# Patient Record
Sex: Male | Born: 1980 | Race: Black or African American | Hispanic: No | Marital: Single | Smoking: Current every day smoker
Health system: Southern US, Community
[De-identification: ages and names within clinical notes are randomized; demographics above are authoritative.]

## PROBLEM LIST (undated history)

## (undated) DIAGNOSIS — R0600 Dyspnea, unspecified: Secondary | ICD-10-CM

## (undated) DIAGNOSIS — R569 Unspecified convulsions: Secondary | ICD-10-CM

---

## 2012-07-22 HISTORY — PX: ORIF DISTAL RADIUS FRACTURE: SUR927

## 2014-07-28 ENCOUNTER — Encounter (HOSPITAL_COMMUNITY): Payer: Self-pay | Admitting: Cardiology

## 2014-07-28 ENCOUNTER — Emergency Department (HOSPITAL_COMMUNITY)
Admission: EM | Admit: 2014-07-28 | Discharge: 2014-07-28 | Disposition: A | Discharge status: Home / Self Care | Payer: Self-pay | Attending: Emergency Medicine | Admitting: Emergency Medicine

## 2014-07-28 DIAGNOSIS — Y9389 Activity, other specified: Secondary | ICD-10-CM | POA: Insufficient documentation

## 2014-07-28 DIAGNOSIS — Z720 Tobacco use: Secondary | ICD-10-CM | POA: Insufficient documentation

## 2014-07-28 DIAGNOSIS — S4991XA Unspecified injury of right shoulder and upper arm, initial encounter: Secondary | ICD-10-CM | POA: Insufficient documentation

## 2014-07-28 DIAGNOSIS — Y998 Other external cause status: Secondary | ICD-10-CM | POA: Insufficient documentation

## 2014-07-28 DIAGNOSIS — S4992XA Unspecified injury of left shoulder and upper arm, initial encounter: Secondary | ICD-10-CM | POA: Insufficient documentation

## 2014-07-28 DIAGNOSIS — S3992XA Unspecified injury of lower back, initial encounter: Secondary | ICD-10-CM | POA: Insufficient documentation

## 2014-07-28 DIAGNOSIS — Y92410 Unspecified street and highway as the place of occurrence of the external cause: Secondary | ICD-10-CM | POA: Insufficient documentation

## 2014-07-28 DIAGNOSIS — S161XXA Strain of muscle, fascia and tendon at neck level, initial encounter: Secondary | ICD-10-CM | POA: Insufficient documentation

## 2014-07-28 MED ORDER — TRAMADOL HCL 50 MG PO TABS: 50.0000 mg | ORAL_TABLET | Freq: Four times a day (QID) | ORAL | Status: DC | PRN

## 2014-07-28 MED ORDER — NAPROXEN 500 MG PO TABS: 500.0000 mg | ORAL_TABLET | Freq: Two times a day (BID) | ORAL | Status: DC
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
## 2015-07-05 ENCOUNTER — Encounter (HOSPITAL_COMMUNITY): Payer: Self-pay | Admitting: Emergency Medicine

## 2015-07-05 ENCOUNTER — Emergency Department (HOSPITAL_COMMUNITY)
Admission: EM | Admit: 2015-07-05 | Discharge: 2015-07-05 | Disposition: A | Discharge status: Home / Self Care | Payer: Self-pay | Attending: Emergency Medicine | Admitting: Emergency Medicine

## 2015-07-05 DIAGNOSIS — Y9289 Other specified places as the place of occurrence of the external cause: Secondary | ICD-10-CM | POA: Insufficient documentation

## 2015-07-05 DIAGNOSIS — F430 Acute stress reaction: Secondary | ICD-10-CM

## 2015-07-05 DIAGNOSIS — F410 Panic disorder [episodic paroxysmal anxiety] without agoraphobia: Secondary | ICD-10-CM | POA: Insufficient documentation

## 2015-07-05 DIAGNOSIS — G40909 Epilepsy, unspecified, not intractable, without status epilepticus: Secondary | ICD-10-CM | POA: Insufficient documentation

## 2015-07-05 DIAGNOSIS — S0993XA Unspecified injury of face, initial encounter: Secondary | ICD-10-CM | POA: Insufficient documentation

## 2015-07-05 DIAGNOSIS — Y9389 Activity, other specified: Secondary | ICD-10-CM | POA: Insufficient documentation

## 2015-07-05 DIAGNOSIS — F17200 Nicotine dependence, unspecified, uncomplicated: Secondary | ICD-10-CM | POA: Insufficient documentation

## 2015-07-05 DIAGNOSIS — W1839XA Other fall on same level, initial encounter: Secondary | ICD-10-CM | POA: Insufficient documentation

## 2015-07-05 DIAGNOSIS — Z79899 Other long term (current) drug therapy: Secondary | ICD-10-CM | POA: Insufficient documentation

## 2015-07-05 DIAGNOSIS — Y998 Other external cause status: Secondary | ICD-10-CM | POA: Insufficient documentation

## 2015-07-05 HISTORY — DX: Unspecified convulsions: R56.9

## 2015-07-05 MED ORDER — LEVETIRACETAM 500 MG PO TABS: 500.0000 mg | ORAL_TABLET | Freq: Two times a day (BID) | ORAL | Status: DC

## 2015-07-05 MED ORDER — LORAZEPAM 1 MG PO TABS
1.0000 mg | ORAL_TABLET | Freq: Once | ORAL | Status: AC
  Administered 2015-07-05: 1 mg via ORAL
  Filled 2015-07-05: qty 1

## 2015-07-05 MED ORDER — LEVETIRACETAM 500 MG PO TABS
1000.0000 mg | ORAL_TABLET | Freq: Once | ORAL | Status: AC
  Administered 2015-07-05: 1000 mg via ORAL
  Filled 2015-07-05: qty 2

## 2015-07-05 NOTE — ED Notes (Signed)
Bed: WA05 Expected date:  Expected time:  Means of arrival:  Comments: EMS 

## 2015-07-05 NOTE — ED Notes (Signed)
Bed: WA21 Expected date:  Expected time:  Means of arrival:  Comments: EMS- 34yo M, dizziness/weakness

## 2015-07-05 NOTE — ED Notes (Signed)
MD at bedside. 

## 2015-07-05 NOTE — ED Notes (Signed)
AVS explained in detail. Medication regimen explained. All paperwork given to [employer] to give to nurse at jail. No other c/c. Pt A&Ox4. Neurologically intact.

## 2015-07-05 NOTE — ED Notes (Addendum)
Per EMS- upon arrival to jail, pt reports hx seizures. Pt says he feels like he is going to have a seizure if he stays at the jail. Reports stress-induced seizures. No actual seizure witnessed. Melina Fiddlerakes Qurasol at 5 am and 9 pm-received from "some hospital in [place]." No other complaints. VSS: BP 148/74 HR 84 RR 26 CBG 94 mg/dl. Also says seizures usually "happen while I'm sleeping."  Addendum-pt denies taking Qurasol but missed Keppra dose today. Also donated blood and plasma today.

## 2015-07-05 NOTE — Discharge Instructions (Signed)
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
## 2017-12-21 ENCOUNTER — Emergency Department (HOSPITAL_COMMUNITY): Payer: Self-pay

## 2017-12-21 ENCOUNTER — Other Ambulatory Visit: Payer: Self-pay

## 2017-12-21 ENCOUNTER — Encounter (HOSPITAL_COMMUNITY): Payer: Self-pay | Admitting: Emergency Medicine

## 2017-12-21 ENCOUNTER — Emergency Department (HOSPITAL_COMMUNITY)
Admission: EM | Admit: 2017-12-21 | Discharge: 2017-12-22 | Disposition: A | Discharge status: Home / Self Care | Payer: Self-pay | Attending: Emergency Medicine | Admitting: Emergency Medicine

## 2017-12-21 DIAGNOSIS — F17210 Nicotine dependence, cigarettes, uncomplicated: Secondary | ICD-10-CM | POA: Insufficient documentation

## 2017-12-21 DIAGNOSIS — Y9301 Activity, walking, marching and hiking: Secondary | ICD-10-CM | POA: Insufficient documentation

## 2017-12-21 DIAGNOSIS — S62231A Other displaced fracture of base of first metacarpal bone, right hand, initial encounter for closed fracture: Secondary | ICD-10-CM

## 2017-12-21 DIAGNOSIS — W109XXA Fall (on) (from) unspecified stairs and steps, initial encounter: Secondary | ICD-10-CM | POA: Insufficient documentation

## 2017-12-21 DIAGNOSIS — Y929 Unspecified place or not applicable: Secondary | ICD-10-CM | POA: Insufficient documentation

## 2017-12-21 DIAGNOSIS — Z79899 Other long term (current) drug therapy: Secondary | ICD-10-CM | POA: Insufficient documentation

## 2017-12-21 DIAGNOSIS — Y999 Unspecified external cause status: Secondary | ICD-10-CM | POA: Insufficient documentation

## 2017-12-21 NOTE — ED Triage Notes (Signed)
Pt fell going up steps approx 1 hour ago.  C/o pain and swelling to R thumb.

## 2017-12-22 MED ORDER — HYDROCODONE-ACETAMINOPHEN 5-325 MG PO TABS: 1.0000 | ORAL_TABLET | Freq: Four times a day (QID) | ORAL | 0 refills | Status: DC | PRN

## 2017-12-22 MED ORDER — HYDROCODONE-ACETAMINOPHEN 5-325 MG PO TABS
2.0000 | ORAL_TABLET | Freq: Once | ORAL | Status: AC
  Administered 2017-12-22: 2 via ORAL
  Filled 2017-12-22: qty 2

## 2017-12-22 MED ORDER — IBUPROFEN 600 MG PO TABS: 600.0000 mg | ORAL_TABLET | Freq: Four times a day (QID) | ORAL | 0 refills | Status: DC | PRN

## 2017-12-22 NOTE — ED Provider Notes (Signed)
[facility] Provider Note   CSN: [id number] Arrival date & time: 12/21/17  2301     History   Chief Complaint Chief Complaint  Patient presents with  . Hand Pain  . Fall    HPI Allyne GeeJames Springston is a 37 y.o. male.  HPI  Patient is a 37 year old male with a history of seizure disorder presenting for right hand pain after a fall.  Patient reports that he was walking upstairs with his hand in his pocket trying to get his keys out of the pocket, when he tripped on the steps, and was unable to get his hand out of the pocket.  He reports that he went down with the right thumb in flexion.  Patient reports he is a difficulty with range of motion of the thumb since then, and has noted significant swelling to the thenar compartment.  Patient denies any numbness or discoloration of the thumb after this incident.  No wound occurred.  Past Medical History:  Diagnosis Date  . Seizures (HCC)     There are no active problems to display for this patient.   History reviewed. No pertinent surgical history.      Home Medications    Prior to Admission medications   Medication Sig Start Date End Date Taking? Authorizing Provider  levETIRAcetam (KEPPRA) 1000 MG tablet Take 1,000 mg by mouth 2 (two) times daily.    [provider]  levETIRAcetam (KEPPRA) 500 MG tablet Take 1 tablet (500 mg total) by mouth 2 (two) times daily. Administer while patient is incarcerated. First dose today given the morning of 12\15 Can resume his home medication once out of jail. 07/05/15   Arby BarrettePfeiffer, Marcy, MD  LORazepam (ATIVAN) 1 MG tablet Take 1 mg by mouth every 8 (eight) hours as needed for anxiety or sleep.    [provider]    Family History No family history on file.  Social History Social History   Tobacco Use  . Smoking status: Current Every Day Smoker  . Smokeless tobacco: Never Used  Substance Use Topics  . Alcohol use: Yes  . Drug use: No      Allergies   Patient has no known allergies.   Review of Systems Review of Systems  Musculoskeletal: Positive for arthralgias and joint swelling.  Skin: Negative for color change and wound.  Neurological: Negative for weakness and numbness.     Physical Exam Updated Vital Signs BP 119/82 (BP Location: Left Arm)   Pulse 93   Temp 98.9 F (37.2 C) (Oral)   Resp 16   Ht 5\' 10"  (1.778 m)   Wt 68 kg (150 lb)   SpO2 100%   BMI 21.52 kg/m   Physical Exam  Constitutional: He appears well-developed and well-nourished. No distress.  Sitting comfortably in bed.  HENT:  Head: Normocephalic and atraumatic.  Eyes: Conjunctivae are normal. Right eye exhibits no discharge. Left eye exhibits no discharge.  EOMs normal to gross examination.  Neck: Normal range of motion.  Cardiovascular: Normal rate and regular rhythm.  Intact, 2+ radial pulse of RUE.   Pulmonary/Chest:  Normal respiratory effort. Patient converses comfortably. No audible wheeze or stridor.  Abdominal: Soft. He exhibits no distension.  Musculoskeletal: Normal range of motion.  Right Hand Exam:  Inspection: Swelling of the thenar compartment. Palpation: Point tenderness of the carpometacarpal joint of the thumb.  No anatomic snuffbox tenderness.  No distal radius tenderness. ROM: Patient able to perform range of motion of  all fingers of the right hand. Ligamentous stability: Unable to assess laxity of the thumb with valgus stress due to acute injury.  Flexor/Extensor tendons: FDS/FDP tendons intact in digits 2-5 at PIP/DIP joints, respectively; extensor tendons intact in all digits Nerve testing:  -Radial: Thumb/wrist extension intact and 5/5 strength with resistance. Sensation to light touch intact at dorsal CMC joint. -Median: Thumb opposition intact and 5/5 strength with resistance. Sensation to light touch intact on palmar side of 1st and 2nd digits. -Ulnar: Thumb adduction intact and 5/5 strength with  resistance. Sensation to light touch intact on palmar side of 5th digit. Vascular: 2+ radial and ulnar pulses. Capillary refill <2 seconds b/l.   Neurological: He is alert.  Cranial nerves intact to gross observation. Patient moves extremities without difficulty.  Skin: Skin is warm and dry. He is not diaphoretic.  Psychiatric: He has a normal mood and affect. His behavior is normal. Judgment and thought content normal.  Nursing note and vitals reviewed.    ED Treatments / Results  Labs (all labs ordered are listed, but only abnormal results are displayed) Labs Reviewed - No data to display  EKG None  Radiology Dg Hand Complete Right  Result Date: 12/21/2017 CLINICAL DATA:  Fall tonight down steps with right thumb pain and swelling. EXAM: RIGHT HAND - COMPLETE 3+ VIEW COMPARISON:  None. FINDINGS: Comminuted fracture at the base of the thumb metacarpal extends into the carpal metacarpal joint. There is a 2 mm displacement of fracture fragments. Fracture line involving the articular surface is mildly displaced. No additional fracture of the hand. Alignment is otherwise maintained. IMPRESSION: Comminuted displaced fracture base of the thumb metacarpal, with intra-articular extension to the carpal metacarpal joint. Electronically Signed   By: Rubye Oaks M.D.   On: 12/21/2017 23:47    Procedures Procedures (including critical care time)  Medications Ordered in ED Medications  HYDROcodone-acetaminophen (NORCO/VICODIN) 5-325 MG per tablet 2 tablet (2 tablets Oral Given 12/22/17 0038)     Initial Impression / Assessment and Plan / ED Course  I have reviewed the triage vital signs and the nursing notes.  Pertinent labs & imaging results that were available during my care of the patient were reviewed by me and considered in my medical decision making (see chart for details).  Clinical Course as of Dec 22 128  Wynelle Link Dec 21, 2017  1245 Patient verbally verified a safe ride from the  ED. Proceeded with prescribing Norco for pain/relaxtion/muscle relaxation in the ED.   [AM]  Mon Dec 22, 2017  0111 I have reviewed the patient's information in the [place] Controlled Substance Database for the past 12 months and found them to have no Rx on record.  Opiates were prescribed for an acute, painful condition. The patient was given information on side effects and encouraged to use other, non-opiate pain medication primary, only using opiate medicine sparingly for severe pain.   [AM]    Clinical Course User Index [AM] Elisha Ponder, PA-C   Patient well-appearing neurovascular intact after injury to the right hand.  Patient has a comminuted and slightly displaced fracture at base of thumb metacarpal with extension into the carpometacarpal joint of the thumb of right hand.  Patient is not on spica splint.  Good 2+ capillary refill after splint placement.  Patient given follow-up with hand for further evaluation.  Patient instructed that he must keep it immobilized, as he has an intra-articular fracture.  Patient is understanding and agrees with plan of care.  This is a supervised visit with Dr. Drema Pry. Evaluation, management, and discharge planning discussed with this attending physician.  Final Clinical Impressions(s) / ED Diagnoses   Final diagnoses:  Closed displaced fracture of base of first metacarpal bone of right hand, unspecified fracture morphology, initial encounter    ED Discharge Orders    None       Delia Chimes 12/22/17 0133    Nira Conn, MD 12/22/17 [phone]

## 2017-12-22 NOTE — ED Notes (Signed)
Pt has ice on the wrist and thumb

## 2017-12-22 NOTE — ED Notes (Signed)
Ortho tech at bedside 

## 2017-12-22 NOTE — ED Notes (Signed)
Pt verbalized understanding of d/c instructions and follow up as well as splint care

## 2017-12-22 NOTE — Discharge Instructions (Addendum)
Please see the information and instructions below regarding your visit.  Your diagnoses today include:  1. Closed displaced fracture of base of first metacarpal bone of right hand, unspecified fracture morphology, initial encounter     Tests performed today include: See side panel of your discharge paperwork for testing performed today. Vital signs are listed at the bottom of these instructions.   You have a fracture at the base of your thumb.  Is very important to follow-up with orthopedics for this.  Medications prescribed:    Take any prescribed medications only as prescribed, and any over the counter medications only as directed on the packaging.  You have been prescribed Norco for pain. This is an opioid pain medication. You may take this medication every 4-6 hours as needed for pain. Only take this medication if you need it for breakthrough pain. You may combine this medicine with ibuoprofen, a non-steroidal anti-inflammatory drug (NSAID) every 6 hours, so you are getting something for pain relief every 3 hours.  Do not combine this medication with Tylenol, as it may increase the risk of liver problems.  Do not combine this medication with alcohol.  Please be advised to avoid driving or operating heavy machinery while taking this medication, as it may make you drowsy or impair judgment.   You are prescribed ibuprofen, a non-steroidal anti-inflammatory agent (NSAID) for pain. You may take 600mg  every 6 hours as needed for pain. If still requiring this medication around the clock for acute pain after 10 days, please see your primary healthcare provider.  Women who are pregnant, breastfeeding, or planning on becoming pregnant should not take non-steroidal anti-inflammatories such as Advil and Aleve. Tylenol is a safe over the counter pain reliever in pregnant women.  This is not a long-term medication unless under the care and direction of your primary provider. Taking this medication  long-term and not under the supervision of a healthcare provider could increase the risk of stomach ulcers, kidney problems, and cardiovascular problems such as high blood pressure.    Home care instructions:  Please follow any educational materials contained in this packet.   Follow-up instructions: Please follow-up with Dr. Dion SaucierLandau of orthopedics.   Return instructions:  Please return to the Emergency Department if you experience worsening symptoms.  Please return to the emergency department if you experience any worsening pain of the thumb, discoloration where it is white or blue, or loss of sensation or cold sensation to the thumb. Please return if you have any other emergent concerns.  Additional Information:   Your vital signs today were: BP 119/82 (BP Location: Left Arm)    Pulse 93    Temp 98.9 F (37.2 C) (Oral)    Resp 16    Ht 5\' 10"  (1.778 m)    Wt 68 kg (150 lb)    SpO2 100%    BMI 21.52 kg/m  If your blood pressure (BP) was elevated on multiple readings during this visit above 130 for the top number or above 80 for the bottom number, please have this repeated by your primary care provider within one month. --------------  Thank you for allowing us to participate in your care today.

## 2017-12-22 NOTE — Progress Notes (Signed)
Orthopedic Tech Progress Note Patient Details:  Allyne GeeJames Crumm 08/05/1980 [id number]  Ortho Devices Type of Ortho Device: Arm sling, Thumb spica splint Splint Material: Fiberglass Ortho Device/Splint Location: rue Ortho Device/Splint Interventions: Ordered, Application, Adjustment   Post Interventions Patient Tolerated: Well Instructions Provided: Care of device, Adjustment of device   Trinna PostMartinez, Ronon Ferger J 12/22/2017, 1:44 AM

## 2017-12-26 ENCOUNTER — Ambulatory Visit (HOSPITAL_BASED_OUTPATIENT_CLINIC_OR_DEPARTMENT_OTHER): Payer: Self-pay | Admitting: Anesthesiology

## 2017-12-26 ENCOUNTER — Ambulatory Visit (HOSPITAL_BASED_OUTPATIENT_CLINIC_OR_DEPARTMENT_OTHER)
Admission: RE | Admit: 2017-12-26 | Discharge: 2017-12-26 | Disposition: A | Discharge status: Home / Self Care | Payer: Self-pay | Source: Ambulatory Visit | Attending: Orthopedic Surgery | Admitting: Orthopedic Surgery

## 2017-12-26 ENCOUNTER — Encounter (HOSPITAL_BASED_OUTPATIENT_CLINIC_OR_DEPARTMENT_OTHER)
Admission: RE | Disposition: A | Discharge status: Home / Self Care | Payer: Self-pay | Source: Ambulatory Visit | Attending: Orthopedic Surgery

## 2017-12-26 ENCOUNTER — Encounter (HOSPITAL_BASED_OUTPATIENT_CLINIC_OR_DEPARTMENT_OTHER): Payer: Self-pay

## 2017-12-26 ENCOUNTER — Other Ambulatory Visit: Payer: Self-pay | Admitting: Orthopedic Surgery

## 2017-12-26 DIAGNOSIS — R569 Unspecified convulsions: Secondary | ICD-10-CM | POA: Insufficient documentation

## 2017-12-26 DIAGNOSIS — R0600 Dyspnea, unspecified: Secondary | ICD-10-CM | POA: Insufficient documentation

## 2017-12-26 DIAGNOSIS — F17200 Nicotine dependence, unspecified, uncomplicated: Secondary | ICD-10-CM | POA: Insufficient documentation

## 2017-12-26 DIAGNOSIS — Z79899 Other long term (current) drug therapy: Secondary | ICD-10-CM | POA: Insufficient documentation

## 2017-12-26 DIAGNOSIS — W109XXA Fall (on) (from) unspecified stairs and steps, initial encounter: Secondary | ICD-10-CM | POA: Insufficient documentation

## 2017-12-26 DIAGNOSIS — S62231A Other displaced fracture of base of first metacarpal bone, right hand, initial encounter for closed fracture: Secondary | ICD-10-CM | POA: Insufficient documentation

## 2017-12-26 HISTORY — PX: CLOSED REDUCTION METACARPAL WITH PERCUTANEOUS PINNING: SHX5613

## 2017-12-26 HISTORY — DX: Dyspnea, unspecified: R06.00

## 2017-12-26 SURGERY — CLOSED REDUCTION, FRACTURE, METACARPAL BONE, WITH PERCUTANEOUS PINNING
Anesthesia: General | Laterality: Right

## 2017-12-26 MED ORDER — FENTANYL CITRATE (PF) 100 MCG/2ML IJ SOLN
INTRAMUSCULAR | Status: AC
  Filled 2017-12-26: qty 2

## 2017-12-26 MED ORDER — PROPOFOL 10 MG/ML IV BOLUS
INTRAVENOUS | Status: DC | PRN
  Administered 2017-12-26: 200 mg via INTRAVENOUS

## 2017-12-26 MED ORDER — ONDANSETRON HCL 4 MG/2ML IJ SOLN
INTRAMUSCULAR | Status: DC | PRN
  Administered 2017-12-26: 4 mg via INTRAVENOUS

## 2017-12-26 MED ORDER — CHLORHEXIDINE GLUCONATE 4 % EX LIQD
60.0000 mL | Freq: Once | CUTANEOUS | Status: DC
  Filled 2017-12-26: qty 118

## 2017-12-26 MED ORDER — CEFAZOLIN SODIUM-DEXTROSE 2-4 GM/100ML-% IV SOLN
INTRAVENOUS | Status: AC
  Filled 2017-12-26: qty 100

## 2017-12-26 MED ORDER — HYDROMORPHONE HCL 2 MG/ML IJ SOLN
INTRAMUSCULAR | Status: AC
  Filled 2017-12-26: qty 1

## 2017-12-26 MED ORDER — LIDOCAINE 2% (20 MG/ML) 5 ML SYRINGE
INTRAMUSCULAR | Status: AC
  Filled 2017-12-26: qty 5

## 2017-12-26 MED ORDER — DEXAMETHASONE SODIUM PHOSPHATE 10 MG/ML IJ SOLN
INTRAMUSCULAR | Status: AC
  Filled 2017-12-26: qty 1

## 2017-12-26 MED ORDER — ONDANSETRON HCL 4 MG/2ML IJ SOLN
INTRAMUSCULAR | Status: AC
  Filled 2017-12-26: qty 2

## 2017-12-26 MED ORDER — PROPOFOL 10 MG/ML IV BOLUS
INTRAVENOUS | Status: AC
  Filled 2017-12-26: qty 40

## 2017-12-26 MED ORDER — LIDOCAINE 2% (20 MG/ML) 5 ML SYRINGE
INTRAMUSCULAR | Status: DC | PRN
  Administered 2017-12-26: 60 mg via INTRAVENOUS

## 2017-12-26 MED ORDER — BUPIVACAINE HCL (PF) 0.25 % IJ SOLN
INTRAMUSCULAR | Status: DC | PRN
  Administered 2017-12-26: 9 mL

## 2017-12-26 MED ORDER — DEXAMETHASONE SODIUM PHOSPHATE 10 MG/ML IJ SOLN
INTRAMUSCULAR | Status: DC | PRN
  Administered 2017-12-26: 10 mg via INTRAVENOUS

## 2017-12-26 MED ORDER — FENTANYL CITRATE (PF) 100 MCG/2ML IJ SOLN
INTRAMUSCULAR | Status: DC | PRN
  Administered 2017-12-26 (×2): 50 ug via INTRAVENOUS

## 2017-12-26 MED ORDER — CEFAZOLIN SODIUM-DEXTROSE 2-4 GM/100ML-% IV SOLN
2.0000 g | INTRAVENOUS | Status: AC
  Administered 2017-12-26: 2 g via INTRAVENOUS
  Filled 2017-12-26: qty 100

## 2017-12-26 MED ORDER — LACTATED RINGERS IV SOLN
INTRAVENOUS | Status: DC
  Administered 2017-12-26: 10:00:00 via INTRAVENOUS
  Filled 2017-12-26: qty 1000

## 2017-12-26 MED ORDER — MIDAZOLAM HCL 5 MG/5ML IJ SOLN
INTRAMUSCULAR | Status: DC | PRN
  Administered 2017-12-26: 2 mg via INTRAVENOUS

## 2017-12-26 MED ORDER — HYDROMORPHONE HCL 1 MG/ML IJ SOLN
INTRAMUSCULAR | Status: DC | PRN
  Administered 2017-12-26: 1 mg via INTRAVENOUS

## 2017-12-26 MED ORDER — MIDAZOLAM HCL 2 MG/2ML IJ SOLN
INTRAMUSCULAR | Status: AC
  Filled 2017-12-26: qty 2

## 2017-12-26 SURGICAL SUPPLY — 64 items
APL SKNCLS STERI-STRIP NONHPOA (GAUZE/BANDAGES/DRESSINGS)
BANDAGE ACE 3X5.8 VEL STRL LF (GAUZE/BANDAGES/DRESSINGS) ×6 IMPLANT
BANDAGE ACE 4X5 VEL STRL LF (GAUZE/BANDAGES/DRESSINGS) IMPLANT
BENZOIN TINCTURE PRP APPL 2/3 (GAUZE/BANDAGES/DRESSINGS) IMPLANT
BLADE SURG 15 STRL LF DISP TIS (BLADE) ×1 IMPLANT
BLADE SURG 15 STRL SS (BLADE) ×3
BNDG CMPR 9X4 STRL LF SNTH (GAUZE/BANDAGES/DRESSINGS) ×1
BNDG ELASTIC 2X5.8 VLCR STR LF (GAUZE/BANDAGES/DRESSINGS) IMPLANT
BNDG ESMARK 4X9 LF (GAUZE/BANDAGES/DRESSINGS) ×3 IMPLANT
BNDG GAUZE ELAST 4 BULKY (GAUZE/BANDAGES/DRESSINGS) ×3 IMPLANT
CANISTER SUCT 1200ML W/VALVE (MISCELLANEOUS) IMPLANT
CLOSURE WOUND 1/2 X4 (GAUZE/BANDAGES/DRESSINGS)
CORD BIPOLAR FORCEPS 12FT (ELECTRODE) IMPLANT
COVER BACK TABLE 60X90IN (DRAPES) ×3 IMPLANT
CUFF TOURNIQUET SINGLE 18IN (TOURNIQUET CUFF) ×3 IMPLANT
DECANTER SPIKE VIAL GLASS SM (MISCELLANEOUS) IMPLANT
DRAPE EXTREMITY T 121X128X90 (DRAPE) ×3 IMPLANT
DRAPE LG THREE QUARTER DISP (DRAPES) ×6 IMPLANT
DRAPE OEC MINIVIEW 54X84 (DRAPES) ×3 IMPLANT
DRAPE SURG 17X23 STRL (DRAPES) ×3 IMPLANT
DURAPREP 26ML APPLICATOR (WOUND CARE) ×3 IMPLANT
GAUZE SPONGE 4X4 12PLY STRL (GAUZE/BANDAGES/DRESSINGS) ×3 IMPLANT
GAUZE SPONGE 4X4 16PLY XRAY LF (GAUZE/BANDAGES/DRESSINGS) IMPLANT
GAUZE XEROFORM 1X8 LF (GAUZE/BANDAGES/DRESSINGS) ×3 IMPLANT
GLOVE BIOGEL M STRL SZ7.5 (GLOVE) ×3 IMPLANT
GLOVE BIOGEL PI IND STRL 6.5 (GLOVE) ×1 IMPLANT
GLOVE BIOGEL PI INDICATOR 6.5 (GLOVE) ×2
GLOVE SURG SS PI 6.5 STRL IVOR (GLOVE) ×3 IMPLANT
GLOVE SURG SYN 8.0 (GLOVE) ×6 IMPLANT
GOWN STRL REUS W/TWL LRG LVL3 (GOWN DISPOSABLE) ×3 IMPLANT
GOWN STRL REUS W/TWL XL LVL3 (GOWN DISPOSABLE) ×6 IMPLANT
NEEDLE HYPO 25X1 1.5 SAFETY (NEEDLE) IMPLANT
NS IRRIG 1000ML POUR BTL (IV SOLUTION) IMPLANT
PACK BASIN DAY SURGERY FS (CUSTOM PROCEDURE TRAY) ×3 IMPLANT
PAD CAST 3X4 CTTN HI CHSV (CAST SUPPLIES) ×2 IMPLANT
PAD CAST 4YDX4 CTTN HI CHSV (CAST SUPPLIES) IMPLANT
PADDING CAST ABS 4INX4YD NS (CAST SUPPLIES) ×2
PADDING CAST ABS COTTON 4X4 ST (CAST SUPPLIES) ×1 IMPLANT
PADDING CAST COTTON 3X4 STRL (CAST SUPPLIES) ×6
PADDING CAST COTTON 4X4 STRL (CAST SUPPLIES)
PADDING UNDERCAST 2 STRL (CAST SUPPLIES) ×2
PADDING UNDERCAST 2X4 STRL (CAST SUPPLIES) ×1 IMPLANT
SPLINT PLASTER CAST XFAST 4X15 (CAST SUPPLIES) IMPLANT
SPLINT PLASTER XTRA FAST SET 4 (CAST SUPPLIES)
STOCKINETTE 4X48 STRL (DRAPES) ×3 IMPLANT
STRIP CLOSURE SKIN 1/2X4 (GAUZE/BANDAGES/DRESSINGS) IMPLANT
SUCTION FRAZIER HANDLE 10FR (MISCELLANEOUS)
SUCTION TUBE FRAZIER 10FR DISP (MISCELLANEOUS) IMPLANT
SUT ETHILON 4 0 PS 2 18 (SUTURE) IMPLANT
SUT ETHILON 5 0 PS 2 18 (SUTURE) IMPLANT
SUT MERSILENE 4 0 P 3 (SUTURE) IMPLANT
SUT VIC AB 4-0 P-3 18XBRD (SUTURE) IMPLANT
SUT VIC AB 4-0 P3 18 (SUTURE)
SUT VICRYL RAPIDE 4-0 (SUTURE) IMPLANT
SUT VICRYL RAPIDE 4/0 PS 2 (SUTURE) IMPLANT
SYR 10ML LL (SYRINGE) IMPLANT
SYR BULB 3OZ (MISCELLANEOUS) IMPLANT
TOWEL OR 17X24 6PK STRL BLUE (TOWEL DISPOSABLE) ×3 IMPLANT
TUBE CONNECTING 20'X1/4 (TUBING)
TUBE CONNECTING 20X1/4 (TUBING) IMPLANT
UNDERPAD 30X30 (UNDERPADS AND DIAPERS) ×3 IMPLANT
k wire double ended ×3 IMPLANT
microaire k - wire ×3 IMPLANT
splint plaster ×3 IMPLANT

## 2017-12-26 NOTE — Anesthesia Preprocedure Evaluation (Signed)
Anesthesia Evaluation  Patient identified by MRN, date of birth, ID band Patient awake    Reviewed: Allergy & Precautions, NPO status , Patient's Chart, lab work & pertinent test results  History of Anesthesia Complications Negative for: history of anesthetic complications  Airway Mallampati: II  TM Distance: >3 FB Neck ROM: Full    Dental no notable dental hx. (+) Dental Advisory Given   Pulmonary Current Smoker,    Pulmonary exam normal        Cardiovascular negative cardio ROS Normal cardiovascular exam     Neuro/Psych negative neurological ROS  negative psych ROS   GI/Hepatic negative GI ROS, Neg liver ROS,   Endo/Other  negative endocrine ROS  Renal/GU negative Renal ROS  negative genitourinary   Musculoskeletal   Abdominal   Peds negative pediatric ROS (+)  Hematology negative hematology ROS (+)   Anesthesia Other Findings   Reproductive/Obstetrics negative OB ROS                            Anesthesia Physical Anesthesia Plan  ASA: II  Anesthesia Plan: General   Post-op Pain Management:    Induction: Intravenous  PONV Risk Score and Plan: 2 and Ondansetron and Dexamethasone  Airway Management Planned: LMA  Additional Equipment:   Intra-op Plan:   Post-operative Plan: Extubation in OR  Informed Consent: I have reviewed the patients History and Physical, chart, labs and discussed the procedure including the risks, benefits and alternatives for the proposed anesthesia with the patient or authorized representative who has indicated his/her understanding and acceptance.   Dental advisory given  Plan Discussed with: CRNA and Anesthesiologist  Anesthesia Plan Comments:        Anesthesia Quick Evaluation  

## 2017-12-26 NOTE — Discharge Instructions (Signed)

## 2017-12-26 NOTE — Op Note (Signed)
Mark Shannon, Mark MEDICAL RECORD [id number] ACCOUNT [id number] DATE OF BIRTH:January 13, 1981 FACILITY: [facility] LOCATION: [facility] PHYSICIAN:Calene Paradiso A. Mina MarbleWEINGOLD, MD  OPERATIVE REPORT  DATE OF PROCEDURE:  12/26/2017  PREOPERATIVE DIAGNOSIS:  Right thumb intra-articular thumb metacarpal base fracture.  POSTOPERATIVE DIAGNOSIS:  Right thumb intraarticular thumb metacarpal base fracture.  PROCEDURE:  Closed reduction and percutaneous pinning of above.  SURGEON:  Dairl PonderMatthew Urian Martenson, MD  ASSISTANT:  None.  ANESTHESIA:  General.  COMPLICATIONS:  None.  DRAINS:  None.   DESCRIPTION OF PROCEDURE:  The patient was taken to the operating suite after induction of adequate general anesthetic.  Right upper extremity was prepped and draped in the usual sterile fashion.  An Esmarch was used to exsanguinate the limb.  Tourniquet  was then inflated to 250 mmHg.  At this point in time, longitudinal traction, pronation and downward pressure on the thumb metacarpal base was used to reduce the Bennett's fracture of the right thumb.  Under direct and fluoroscopic guidance, an 0.062  K-wire and 0.045 K-wire was driven in crossed fashion across the fracture site into the trapezium to stabilize the [facility] and reduced the fracture.  Fluoroscopic imaging in multiple views showed reduction of the fracture.  The K-wires were cut outside  the skin and bent upon themselves.  They were then dressed with Xeroform, 4 x 4's, fluffs and a radial gutter splint.  The patient tolerated this procedure well and went to recovery room in stable fashion.  TN/NUANCE  D:12/26/2017 T:12/26/2017 JOB:[id number]

## 2017-12-26 NOTE — Transfer of Care (Signed)
Immediate Anesthesia Transfer of Care Note  Patient: Mark Shannon  Procedure(s) Performed: CLOSED REDUCTION METACARPAL WITH PERCUTANEOUS PINNING RIGHT THUMB (Right )  Patient Location: PACU  Anesthesia Type:General  Level of Consciousness: drowsy  Airway & Oxygen Therapy: Patient Spontanous Breathing and Patient connected to face mask oxygen  Post-op Assessment: Report given to RN and Post -op Vital signs reviewed and stable  Post vital signs: Reviewed and stable  Last Vitals:  Vitals Value Taken Time  BP 108/58 12/26/2017 12:17 PM  Temp 36.4 C 12/26/2017 12:17 PM  Pulse 57 12/26/2017 12:22 PM  Resp 16 12/26/2017 12:22 PM  SpO2 98 % 12/26/2017 12:22 PM  Vitals shown include unvalidated device data.  Last Pain:  Vitals:   12/26/17 0934  TempSrc:   PainSc: 4       Patients Stated Pain Goal: 4 (12/26/17 0934)  Complications: No apparent anesthesia complications

## 2017-12-26 NOTE — H&P (Signed)
Mark Shannon is an 37 y.o. male.   Chief Complaint: right thumb pain HPI: patient's a very pleasant 37 year old left-hand-dominant male status post right hand trauma with radiographs that show displaced fracture of the base of the thumb metacarpal intra-articular  Past Medical History:  Diagnosis Date  . Dyspnea    on occiasona  . Seizures (HCC)     Past Surgical History:  Procedure Laterality Date  . ORIF DISTAL RADIUS FRACTURE Left 2014    History reviewed. No pertinent family history. Social History:  reports that he has been smoking.  He has a 10.00 pack-year smoking history. He has never used smokeless tobacco. He reports that he drinks about 0.6 oz of alcohol per week. He reports that he does not use drugs.  Allergies: No Known Allergies  Medications Prior to Admission  Medication Sig Dispense Refill  . HYDROcodone-acetaminophen (NORCO/VICODIN) 5-325 MG tablet Take 1-2 tablets by mouth every 6 (six) hours as needed. 8 tablet 0  . ibuprofen (ADVIL,MOTRIN) 600 MG tablet Take 1 tablet (600 mg total) by mouth every 6 (six) hours as needed. 30 tablet 0  . levETIRAcetam (KEPPRA) 500 MG tablet Take 1 tablet (500 mg total) by mouth 2 (two) times daily. Administer while patient is incarcerated. First dose today given the morning of 12\15 Can resume his home medication once out of jail. 60 tablet 0  . LORazepam (ATIVAN) 1 MG tablet Take 1 mg by mouth every 8 (eight) hours as needed for anxiety or sleep.      No results found for this or any previous visit (from the past 48 hour(s)). No results found.  Review of Systems  All other systems reviewed and are negative.   Blood pressure (!) 133/92, pulse (!) 57, temperature 99.4 F (37.4 C), temperature source Oral, resp. rate 16, height 5\' 11"  (1.803 m), weight 66.9 kg (147 lb 8 oz), SpO2 100 %. Physical Exam  Constitutional: He appears well-developed and well-nourished.  HENT:  Head: Normocephalic and atraumatic.  Neck: Normal range  of motion.  Cardiovascular: Normal rate.  Respiratory: Effort normal.  Musculoskeletal:       Right hand: He exhibits tenderness, bony tenderness, deformity and swelling.  Right thumb pain, swelling, and deformity status post trauma  Skin: Skin is warm.  Psychiatric: He has a normal mood and affect. His behavior is normal. Judgment and thought content normal.     Assessment/Plan 37 year old male with intra-articular thumb fracture at the metacarpal base. I discussed with the patient great detail the nature this injury and the need for restoration of joint congruity. Patient presents wrist and benefits and wished to proceed with closed reduction and percutaneous pinning of this fracture.  Mark ShoresMatthew A Oluwadamilola Rosamond, MD 12/26/2017, 11:37 AM

## 2017-12-26 NOTE — Anesthesia Procedure Notes (Signed)
Procedure Name: LMA Insertion Date/Time: 12/26/2017 11:46 AM Performed by: Yolonda Kidaarver, Lydiana Milley L, CRNA Pre-anesthesia Checklist: Patient identified, Emergency Drugs available, Suction available and Patient being monitored Patient Re-evaluated:Patient Re-evaluated prior to induction Oxygen Delivery Method: Circle system utilized Preoxygenation: Pre-oxygenation with 100% oxygen Induction Type: IV induction Ventilation: Mask ventilation without difficulty LMA: LMA inserted LMA Size: 4.0 Number of attempts: 1 Placement Confirmation: positive ETCO2,  CO2 detector and breath sounds checked- equal and bilateral Tube secured with: Tape Dental Injury: Teeth and Oropharynx as per pre-operative assessment

## 2017-12-26 NOTE — Op Note (Signed)
Please see dictated report [phone]

## 2017-12-29 ENCOUNTER — Encounter (HOSPITAL_BASED_OUTPATIENT_CLINIC_OR_DEPARTMENT_OTHER): Payer: Self-pay | Admitting: Orthopedic Surgery

## 2017-12-29 NOTE — Anesthesia Postprocedure Evaluation (Signed)
Anesthesia Post Note  Patient: Mark Shannon  Procedure(s) Performed: CLOSED REDUCTION METACARPAL WITH PERCUTANEOUS PINNING RIGHT THUMB (Right )     Anesthesia Post Evaluation  Last Vitals:  Vitals:   12/26/17 1315 12/26/17 1405  BP: 128/86 130/86  Pulse: (!) 57 (!) 57  Resp: 17 16  Temp:  36.7 C  SpO2: 99% 100%    Last Pain:  Vitals:   12/29/17 1327  TempSrc:   PainSc: 5                  Jaqlyn Gruenhagen,Atharva DANIEL

## 2021-10-28 ENCOUNTER — Other Ambulatory Visit: Payer: Self-pay

## 2021-10-28 ENCOUNTER — Emergency Department (HOSPITAL_COMMUNITY): Payer: Self-pay

## 2021-10-28 ENCOUNTER — Emergency Department (HOSPITAL_COMMUNITY)
Admission: EM | Admit: 2021-10-28 | Discharge: 2021-10-28 | Disposition: A | Discharge status: Home / Self Care | Payer: Self-pay | Attending: Emergency Medicine | Admitting: Emergency Medicine

## 2021-10-28 ENCOUNTER — Encounter (HOSPITAL_COMMUNITY): Payer: Self-pay

## 2021-10-28 DIAGNOSIS — H66001 Acute suppurative otitis media without spontaneous rupture of ear drum, right ear: Secondary | ICD-10-CM | POA: Insufficient documentation

## 2021-10-28 DIAGNOSIS — I10 Essential (primary) hypertension: Secondary | ICD-10-CM | POA: Insufficient documentation

## 2021-10-28 DIAGNOSIS — F17200 Nicotine dependence, unspecified, uncomplicated: Secondary | ICD-10-CM | POA: Insufficient documentation

## 2021-10-28 DIAGNOSIS — Z79899 Other long term (current) drug therapy: Secondary | ICD-10-CM | POA: Insufficient documentation

## 2021-10-28 DIAGNOSIS — R110 Nausea: Secondary | ICD-10-CM | POA: Insufficient documentation

## 2021-10-28 DIAGNOSIS — R0789 Other chest pain: Secondary | ICD-10-CM | POA: Insufficient documentation

## 2021-10-28 LAB — BASIC METABOLIC PANEL
Anion gap: 6 (ref 5–15)
BUN: 11 mg/dL (ref 6–20)
CO2: 27 mmol/L (ref 22–32)
Calcium: 8.9 mg/dL (ref 8.9–10.3)
Chloride: 107 mmol/L (ref 98–111)
Creatinine, Ser: 1.33 mg/dL — ABNORMAL HIGH (ref 0.61–1.24)
GFR, Estimated: >60 mL/min (ref >60)
Glucose, Bld: 92 mg/dL (ref 70–99)
Potassium: 3.8 mmol/L (ref 3.5–5.1)
Sodium: 140 mmol/L (ref 135–145)

## 2021-10-28 LAB — HEPATIC FUNCTION PANEL
ALT: 10 U/L (ref 0–44)
AST: 13 U/L — ABNORMAL LOW (ref 15–41)
Albumin: 3.7 g/dL (ref 3.5–5.0)
Alkaline Phosphatase: 50 U/L (ref 38–126)
Bilirubin, Direct: 0.1 mg/dL (ref 0.0–0.2)
Indirect Bilirubin: 0.3 mg/dL (ref 0.3–0.9)
Total Bilirubin: 0.4 mg/dL (ref 0.3–1.2)
Total Protein: 6.9 g/dL (ref 6.5–8.1)

## 2021-10-28 LAB — CBC
HCT: 41.1 % (ref 39.0–52.0)
Hemoglobin: 13.6 g/dL (ref 13.0–17.0)
MCH: 29.4 pg (ref 26.0–34.0)
MCHC: 33.1 g/dL (ref 30.0–36.0)
MCV: 88.8 fL (ref 80.0–100.0)
Platelets: 367 10*3/uL (ref 150–400)
RBC: 4.63 MIL/uL (ref 4.22–5.81)
RDW: 14.6 % (ref 11.5–15.5)
WBC: 8.8 10*3/uL (ref 4.0–10.5)
nRBC: 0 % (ref 0.0–0.2)

## 2021-10-28 LAB — LIPASE, BLOOD: Lipase: 33 U/L (ref 11–51)

## 2021-10-28 LAB — TROPONIN I (HIGH SENSITIVITY): Troponin I (High Sensitivity): 5 ng/L (ref <18)

## 2021-10-28 MED ORDER — LIDOCAINE VISCOUS HCL 2 % MT SOLN
15.0000 mL | Freq: Once | OROMUCOSAL | Status: AC
  Administered 2021-10-28: 15 mL via ORAL
  Filled 2021-10-28: qty 15

## 2021-10-28 MED ORDER — SUCRALFATE 1 GM/10ML PO SUSP: 1.0000 g | Freq: Three times a day (TID) | ORAL | 0 refills | Status: AC

## 2021-10-28 MED ORDER — PANTOPRAZOLE SODIUM 20 MG PO TBEC: 20.0000 mg | DELAYED_RELEASE_TABLET | Freq: Every day | ORAL | 0 refills | Status: AC

## 2021-10-28 MED ORDER — ALUM & MAG HYDROXIDE-SIMETH 200-200-20 MG/5ML PO SUSP
30.0000 mL | Freq: Once | ORAL | Status: AC
  Administered 2021-10-28: 30 mL via ORAL
  Filled 2021-10-28: qty 30

## 2021-10-28 MED ORDER — AMOXICILLIN-POT CLAVULANATE 875-125 MG PO TABS: 1.0000 | ORAL_TABLET | Freq: Two times a day (BID) | ORAL | 0 refills | Status: AC

## 2021-10-28 NOTE — ED Provider Notes (Addendum)
?Mark Shannon ?Provider Note ? ? ?CSN: [id number] ?Arrival date & time: 10/28/21  0805 ? ?  ? ?History ? ?Chief Complaint  ?Patient presents with  ? Otalgia  ? Allergic Reaction  ? ? ?Mark Shannon is a 41 y.o. male who reports a history of hypertension and smoking, otherwise healthy no daily medication use. ? ?Patient presented to the ER today for 2 separate problems. ? ?Patient's primary concern is for a possible allergic reaction.  Patient reports that yesterday he bought some pomegranate/grape water, he reports he had never tried this before.  Patient reports that he developed chest pain after drinking this water, he reports it is a burning sensation in the epigastrium radiating up to the center part of his chest, he reports pain was mild constant and without alleviating factors, has not attempted any medication prior to arrival for this.  He denies similar symptoms in the past.  Patient thought this may be an allergic reaction so he attempted to vomit without improvement.  Symptoms are associated with nausea. ? ?Patient's second concern is for right-sided otalgia.  Patient reports that he has noticed right ear pain for the past 7 days, he reports he is experiencing a "head cold" the week before which has mostly improved, right ear pain has continued, a constant pressure-like pain which is moderate in intensity no clear aggravating or alleviating factors and associated with some decreased hearing on that side. ? ? ?Patient has fever, chills, fall, injury, headache/vision changes, neck pain, cough/hemoptysis, abdominal pain, diarrhea, hematemesis, extremity swelling/color change or any additional concerns ? ? ?HPI ? ?  ? ?Home Medications ?Prior to Admission medications   ?Medication Sig Start Date End Date Taking? Authorizing Provider  ?amoxicillin-clavulanate (AUGMENTIN) 875-125 MG tablet Take 1 tablet by mouth every 12 (twelve) hours for 7 days. 10/28/21 11/04/21 Yes Nuala Alpha A, PA-C  ?pantoprazole (PROTONIX) 20 MG tablet Take 1 tablet (20 mg total) by mouth daily. 10/28/21 11/27/21 Yes Nuala Alpha A, PA-C  ?sucralfate (CARAFATE) 1 GM/10ML suspension Take 10 mLs (1 g total) by mouth 4 (four) times daily -  with meals and at bedtime. 10/28/21  Yes Deliah Boston, PA-C  ?   ? ?Allergies    ?Patient has no known allergies.   ? ?Review of Systems   ?Review of Systems Ten systems are reviewed and are negative for acute change except as noted in the HPI ? ?Physical Exam ?Updated Vital Signs ?BP (!) 129/98 (BP Location: Right Arm)   Pulse 77   Temp 98.4 ?F (36.9 ?C) (Oral)   Resp 14   Ht 6' (1.829 m)   Wt 79.4 kg   SpO2 99%   BMI 23.73 kg/m?  ?Physical Exam ?Constitutional:   ?   General: He is not in acute distress. ?   Appearance: Normal appearance. He is well-developed. He is not ill-appearing or diaphoretic.  ?HENT:  ?   Head: Normocephalic and atraumatic.  ?   Jaw: There is normal jaw occlusion.  ?   Right Ear: External ear normal. A middle ear effusion is present. No mastoid tenderness. Tympanic membrane is erythematous and bulging.  ?   Left Ear: Tympanic membrane and external ear normal.  ?   Mouth/Throat:  ?   Mouth: Mucous membranes are moist.  ?   Pharynx: Oropharynx is clear. Uvula midline.  ?Eyes:  ?   General: Vision grossly intact. Gaze aligned appropriately.  ?   Extraocular Movements: Extraocular movements intact.  ?  Conjunctiva/sclera: Conjunctivae normal.  ?   Pupils: Pupils are equal, round, and reactive to light.  ?Neck:  ?   Trachea: Trachea and phonation normal.  ?Cardiovascular:  ?   Rate and Rhythm: Normal rate and regular rhythm.  ?   Pulses: Normal pulses.  ?   Heart sounds: Normal heart sounds.  ?Pulmonary:  ?   Effort: Pulmonary effort is normal. No respiratory distress.  ?   Breath sounds: Normal breath sounds.  ?Abdominal:  ?   General: There is no distension.  ?   Palpations: Abdomen is soft.  ?   Tenderness: There is no abdominal tenderness.  There is no guarding or rebound. Negative signs include Murphy's sign.  ?Musculoskeletal:     ?   General: Normal range of motion.  ?   Cervical back: Normal range of motion and neck supple.  ?Skin: ?   General: Skin is warm and dry.  ?   Comments: No rashes or urticaria  ?Neurological:  ?   Mental Status: He is alert.  ?   GCS: GCS eye subscore is 4. GCS verbal subscore is 5. GCS motor subscore is 6.  ?   Comments: Speech is clear and goal oriented, follows commands ?Major Cranial nerves without deficit, no facial droop ?Moves extremities without ataxia, coordination intact  ?Psychiatric:     ?   Behavior: Behavior normal.  ? ? ?ED Results / Procedures / Treatments   ?Labs ?(all labs ordered are listed, but only abnormal results are displayed) ?Labs Reviewed  ?BASIC METABOLIC PANEL - Abnormal; Notable for the following components:  ?    Result Value  ? Creatinine, Ser 1.33 (*)   ? All other components within normal limits  ?HEPATIC FUNCTION PANEL - Abnormal; Notable for the following components:  ? AST 13 (*)   ? All other components within normal limits  ?CBC  ?LIPASE, BLOOD  ?TROPONIN I (HIGH SENSITIVITY)  ? ? ?EKG ?EKG Interpretation ? ?Date/Time:  Sunday October 28 2021 09:18:01 EDT ?Ventricular Rate:  63 ?PR Interval:  159 ?QRS Duration: 89 ?QT Interval:  431 ?QTC Calculation: 442 ?R Axis:   88 ?Text Interpretation: Sinus rhythm Anteroseptal infarct, age indeterminate Lateral leads are also involved Artifact in lead(s) I III aVR aVL aVF No previous ECGs available Confirmed by Alvira Monday ([id number]) on 10/28/2021 10:02:49 AM ? ?Radiology ?DG Chest 2 View ? ?Result Date: 10/28/2021 ?CLINICAL DATA:  RIGHT-sided earache for approximally 1 week. Headache. EXAM: CHEST - 2 VIEW COMPARISON:  None. FINDINGS: Heart size and mediastinal contours are within normal limits. Lungs are clear. Lung volumes are normal. No pleural effusion or pneumothorax is seen. Osseous structures about the chest are unremarkable. IMPRESSION: No  active cardiopulmonary disease.  No evidence of pneumonia. Electronically Signed   By: Bary Richard M.D.   On: 10/28/2021 09:11   ? ?Procedures ?Procedures  ? ? ?Medications Ordered in ED ?Medications  ?alum & mag hydroxide-simeth (MAALOX/MYLANTA) 200-200-20 MG/5ML suspension 30 mL (30 mLs Oral Given 10/28/21 0957)  ?  And  ?lidocaine (XYLOCAINE) 2 % viscous mouth solution 15 mL (15 mLs Oral Given 10/28/21 0957)  ? ? ?ED Course/ Medical Decision Making/ A&P ?  ?                        ?Medical Decision Making ?Epigastric/chest pain differential includes but not limited to ACS, PE, dissection, PTX, PNA, GERD, Boerhaave's, bronchitis. Onset after drinking pomegranate/grape water yesterday, worsened after he  attempted to vomit.  Patient was concern for an allergic reaction, I see no evidence for allergic reaction on examination today, suspect patient experiencing acid reflux.  Cardiopulmonary exam is unremarkable.  Abdomen is soft and nontender.  Does have history of hypertension as well as a current smoker, will obtain basic cardiac labs along with EKG and chest x-ray. ? ?Right-sided otalgia differential includes but not limited to otitis externa, otitis media, mastoiditis, trigeminal neuralgia, shingles, tympanic membrane perforation, foreign body.  History and examination consistent with otitis media, no indication for imaging or further work-up in regards to ear pain at this time. ? ?Amount and/or Complexity of Data Reviewed ?Labs: ordered. ?   Details: CBC without leukocytosis to suggest infectious process, anemia or thrombocytopenia. ?Lipase within normal limits, doub pancreatitis. ?High sensitive troponin within normal limits, doubt ACS, onset symptoms yesterday, no indication for delta troponin. ?BMP showmild elevation of creatinine at 1.33, no priors to compare.  GFR over 60.  No sign for emergent electrolyte derangement or gap. ?LFTs unremarkable, low suspicion for biliary obstruction ?Radiology: ordered and  independent interpretation performed. ?   Details: Chest x-ray interpreted by radiologist as negative.  I personally reviewed patient's chest x-ray, I do not appreciate any acute cardiopulmonary pathology ?ECG/medici

## 2021-10-28 NOTE — ED Triage Notes (Signed)
Patient states he has had a right sided earache for about one week. Also states he bought new waters yesterday and now c/o acid reflux and overall not feeling well since drinking the water  ?

## 2021-10-28 NOTE — Discharge Instructions (Addendum)
At this time there does not appear to be the presence of an emergent medical condition, however there is always the potential for conditions to change. Please read and follow the below instructions. ? ?Please return to the Emergency Department immediately for any new or worsening symptoms. ?Please be sure to follow up with your Primary Care Provider within one week regarding your visit today; please call their office to schedule an appointment even if you are feeling better for a follow-up visit. ?Please take your antibiotic Augmentin as prescribed until complete to help with your symptoms.  Please drink enough water to avoid dehydration and get plenty of rest.  ?You may take the medication Protonix and Carafate as prescribed to help with acid reflux. ? ?Go to the nearest Emergency Department immediately if: ?You have fever or chills ?You have pain that is not helped with medicine. ?You have swelling, redness, or pain around your ear. ?You get a stiff neck. ?You cannot move part of your face (paralysis). ?You notice that the bone behind your ear hurts when you touch it. ?You get a very bad headache. ?You have a cough that gets worse, or you cough up blood. ?You have very bad (severe) pain in your belly (abdomen). ?You pass out (faint). ?You have either of these for no clear reason: ?Sudden chest discomfort. ?Sudden discomfort in your arms, back, neck, or jaw. ?You have shortness of breath at any time. ?You suddenly start to sweat, or your skin gets clammy. ?You feel sick to your stomach (nauseous). ?You throw up (vomit). ?You suddenly feel lightheaded or dizzy. ?You feel very weak or tired. ?Your heart starts to beat fast, or it feels like it is skipping beats. ?You have any new/concerning or worsening of symptoms. ? ? ?Please read the additional information packets attached to your discharge summary. ? ?Do not take your medicine if  develop an itchy rash, swelling in your mouth or lips, or difficulty breathing;  call 911 and seek immediate emergency medical attention if this occurs. ? ?You may review your lab tests and imaging results in their entirety on your MyChart account.  Please discuss all results of fully with your primary care provider and other specialist at your follow-up visit. ? ?Note: Portions of this text may have been transcribed using voice recognition software. Every effort was made to ensure accuracy; however, inadvertent computerized transcription errors may still be present. ? ?

## 2022-07-04 ENCOUNTER — Encounter (HOSPITAL_COMMUNITY): Payer: Self-pay | Admitting: Emergency Medicine

## 2022-07-04 ENCOUNTER — Ambulatory Visit (HOSPITAL_COMMUNITY)
Admission: EM | Admit: 2022-07-04 | Discharge: 2022-07-04 | Disposition: A | Discharge status: Home / Self Care | Payer: Self-pay | Attending: Internal Medicine | Admitting: Internal Medicine

## 2022-07-04 ENCOUNTER — Other Ambulatory Visit: Payer: Self-pay

## 2022-07-04 DIAGNOSIS — J3489 Other specified disorders of nose and nasal sinuses: Secondary | ICD-10-CM

## 2022-07-04 DIAGNOSIS — R051 Acute cough: Secondary | ICD-10-CM

## 2022-07-04 MED ORDER — BENZONATATE 100 MG PO CAPS: 100.0000 mg | ORAL_CAPSULE | Freq: Three times a day (TID) | ORAL | 0 refills | Status: AC

## 2022-07-04 MED ORDER — CETIRIZINE HCL 10 MG PO TABS: 10.0000 mg | ORAL_TABLET | Freq: Every day | ORAL | 2 refills | Status: AC

## 2022-07-04 NOTE — ED Provider Notes (Signed)
[facility]    CSN: [id number] Arrival date & time: 07/04/22  1118      History   Chief Complaint No chief complaint on file.   HPI Mark Shannon is a 41 y.o. male.   Patient presents urgent care for evaluation of dry cough, runny nose, and fatigue for the last week.  He states 7 days ago, he walked his keys in his car and had to wait for an hour outside in the cold to have the company come to help him unlock his car.  He believes that this caused his illness.  This morning he became slightly nauseated but has been able to tolerate food and fluids well.  He is no longer nauseous.  Denies sore throat, fever/chills, nasal congestion, headache, blurry vision, dizziness, shortness of breath, chest pain, dizziness, and abdominal discomfort.  He is not experiencing any diarrhea or vomiting.  He is a current every day smoker (cigarettes and marijuana), denies all other drug use.  Denies history of chronic respiratory problems.  He has not attempted use of any over-the-counter medications prior to arrival urgent care for his symptoms.     Past Medical History:  Diagnosis Date   Dyspnea    on occiasona   Seizures ([place])     There are no problems to display for this patient.   Past Surgical History:  Procedure Laterality Date   CLOSED REDUCTION METACARPAL WITH PERCUTANEOUS PINNING Right 12/26/2017   Procedure: CLOSED REDUCTION METACARPAL WITH PERCUTANEOUS PINNING RIGHT THUMB;  Surgeon: Charlotte Crumb, MD;  Location: [facility];  Service: Orthopedics;  Laterality: Right;   ORIF DISTAL RADIUS FRACTURE Left 2014       Home Medications    Prior to Admission medications   Medication Sig Start Date End Date Taking? Authorizing Provider  pantoprazole (PROTONIX) 20 MG tablet Take 1 tablet (20 mg total) by mouth daily. Patient not taking: Reported on 07/04/2022 10/28/21 11/27/21  Nuala Alpha A, PA-C  sucralfate (CARAFATE) 1 GM/10ML suspension Take 10 mLs (1 g  total) by mouth 4 (four) times daily -  with meals and at bedtime. Patient not taking: Reported on 07/04/2022 10/28/21   Gari Crown    Family History History reviewed. No pertinent family history.  Social History Social History   Tobacco Use   Smoking status: Every Day    Packs/day: 0.50    Years: 20.00    Total pack years: 10.00    Types: Cigarettes   Smokeless tobacco: Never  Vaping Use   Vaping Use: Never used  Substance Use Topics   Alcohol use: Yes    Alcohol/week: 1.0 standard drink of alcohol    Types: 1 Cans of beer per week    Comment: occasionally   Drug use: Yes    Types: Marijuana     Allergies   Patient has no known allergies.   Review of Systems Review of Systems Per HPI  Physical Exam Triage Vital Signs ED Triage Vitals  Enc Vitals Group     BP 07/04/22 1218 127/87     Pulse Rate 07/04/22 1218 97     Resp 07/04/22 1218 18     Temp 07/04/22 1218 98.4 F (36.9 C)     Temp Source 07/04/22 1218 Oral     SpO2 07/04/22 1218 98 %     Weight --      Height --      Head Circumference --      Peak Flow --  Pain Score 07/04/22 1216 0     Pain Loc --      Pain Edu? --      Excl. in GC? --    No data found.  Updated Vital Signs BP 127/87 (BP Location: Right Arm)   Pulse 97   Temp 98.4 F (36.9 C) (Oral)   Resp 18   SpO2 98%   Visual Acuity Right Eye Distance:   Left Eye Distance:   Bilateral Distance:    Right Eye Near:   Left Eye Near:    Bilateral Near:     Physical Exam Vitals and nursing note reviewed.  Constitutional:      Appearance: Normal appearance. He is not ill-appearing or toxic-appearing.  HENT:     Head: Normocephalic and atraumatic.     Right Ear: Hearing, tympanic membrane, ear canal and external ear normal.     Left Ear: Hearing, tympanic membrane, ear canal and external ear normal.     Nose: Congestion and rhinorrhea present.     Mouth/Throat:     Lips: Pink.     Mouth: Mucous membranes are  moist.     Pharynx: Posterior oropharyngeal erythema present.  Eyes:     General: Lids are normal. Vision grossly intact. Gaze aligned appropriately.     Extraocular Movements: Extraocular movements intact.     Conjunctiva/sclera: Conjunctivae normal.  Cardiovascular:     Rate and Rhythm: Normal rate and regular rhythm.     Heart sounds: Normal heart sounds, S1 normal and S2 normal.  Pulmonary:     Effort: Pulmonary effort is normal. No respiratory distress.     Breath sounds: Normal breath sounds and air entry.  Musculoskeletal:     Cervical back: Neck supple.     Right lower leg: No edema.     Left lower leg: No edema.  Lymphadenopathy:     Cervical: Cervical adenopathy present.  Skin:    General: Skin is warm and dry.     Capillary Refill: Capillary refill takes less than 2 seconds.     Findings: No rash.  Neurological:     General: No focal deficit present.     Mental Status: He is alert and oriented to person, place, and time. Mental status is at baseline.     Cranial Nerves: No dysarthria or facial asymmetry.  Psychiatric:        Mood and Affect: Mood normal.        Speech: Speech normal.        Behavior: Behavior normal.        Thought Content: Thought content normal.        Judgment: Judgment normal.      UC Treatments / Results  Labs (all labs ordered are listed, but only abnormal results are displayed) Labs Reviewed - No data to display  EKG   Radiology No results found.  Procedures Procedures (including critical care time)  Medications Ordered in UC Medications - No data to display  Initial Impression / Assessment and Plan / UC Course  I have reviewed the triage vital signs and the nursing notes.  Pertinent labs & imaging results that were available during my care of the patient were reviewed by me and considered in my medical decision making (see chart for details).  Rhinorrhea and acute cough Symptoms and physical exam consistent with a viral  upper respiratory tract infection that will likely resolve with rest, fluids, and prescriptions for symptomatic relief. No indication for imaging today based  on stable cardiopulmonary exam and hemodynamically stable vital signs. Deferred viral testing as this will not change treatment plan.  Tessalon pearles and cetirizine sent to pharmacy for symptomatic relief to be taken as prescribed.   May use ibuprofen/tylenol over the counter for body aches, fever/chills, and overall discomfort associated with viral illness. Nonpharmacologic interventions for symptom relief provided and after visit summary below.   Strict ED/urgent care return precautions given.  Patient verbalizes understanding and agreement with plan.  Counseled patient regarding possible side effects and uses of all medications prescribed at today's visit.  Patient verbalizes understanding and agreement with plan.  All questions answered.  Patient discharged from urgent care in stable condition.        Final Clinical Impressions(s) / UC Diagnoses   Final diagnoses:  Rhinorrhea  Acute cough     Discharge Instructions      Take cetirizine 10mg  once daily to help dry up your nasal congestion and mucus.  You may take Tessalon Perles every 8 hours as needed for coughing.  Drink plenty of water to stay well-hydrated while you are ill.  You may take tylenol/ibuprofen as needed for aches, pains, and chills.  Your symptoms will likely resolve in the next few days. If you develop any new or worsening symptoms or do not improve in the next 2 to 3 days, please return.  If your symptoms are severe, please go to the emergency room.  Follow-up with your primary care provider for further evaluation and management of your symptoms as well as ongoing wellness visits.  I hope you feel better!   ED Prescriptions   None    PDMP not reviewed this encounter.   Talbot Grumbling, [place] 07/04/22 1242

## 2022-07-04 NOTE — ED Triage Notes (Signed)
Woke this morning feeling lightheaded, and this faded away.  Patient reports some sob.  Patient has had a cough for one week.  Coughing up phlegm in morning and reported as brown in color  Has not taken any medications for symptoms

## 2022-07-04 NOTE — Discharge Instructions (Signed)
Take cetirizine 10mg  once daily to help dry up your nasal congestion and mucus.  You may take Tessalon Perles every 8 hours as needed for coughing.  Drink plenty of water to stay well-hydrated while you are ill.  You may take tylenol/ibuprofen as needed for aches, pains, and chills.  Your symptoms will likely resolve in the next few days. If you develop any new or worsening symptoms or do not improve in the next 2 to 3 days, please return.  If your symptoms are severe, please go to the emergency room.  Follow-up with your primary care provider for further evaluation and management of your symptoms as well as ongoing wellness visits.  I hope you feel better!

## 2023-08-03 IMAGING — CR DG CHEST 2V
2 series · 2 of 2 positions shown · non-contrast
Comparison: None.

CLINICAL DATA: RIGHT-sided earache for approximally 1 week.
Headache.

EXAM:
CHEST - 2 VIEW

[chest pa]
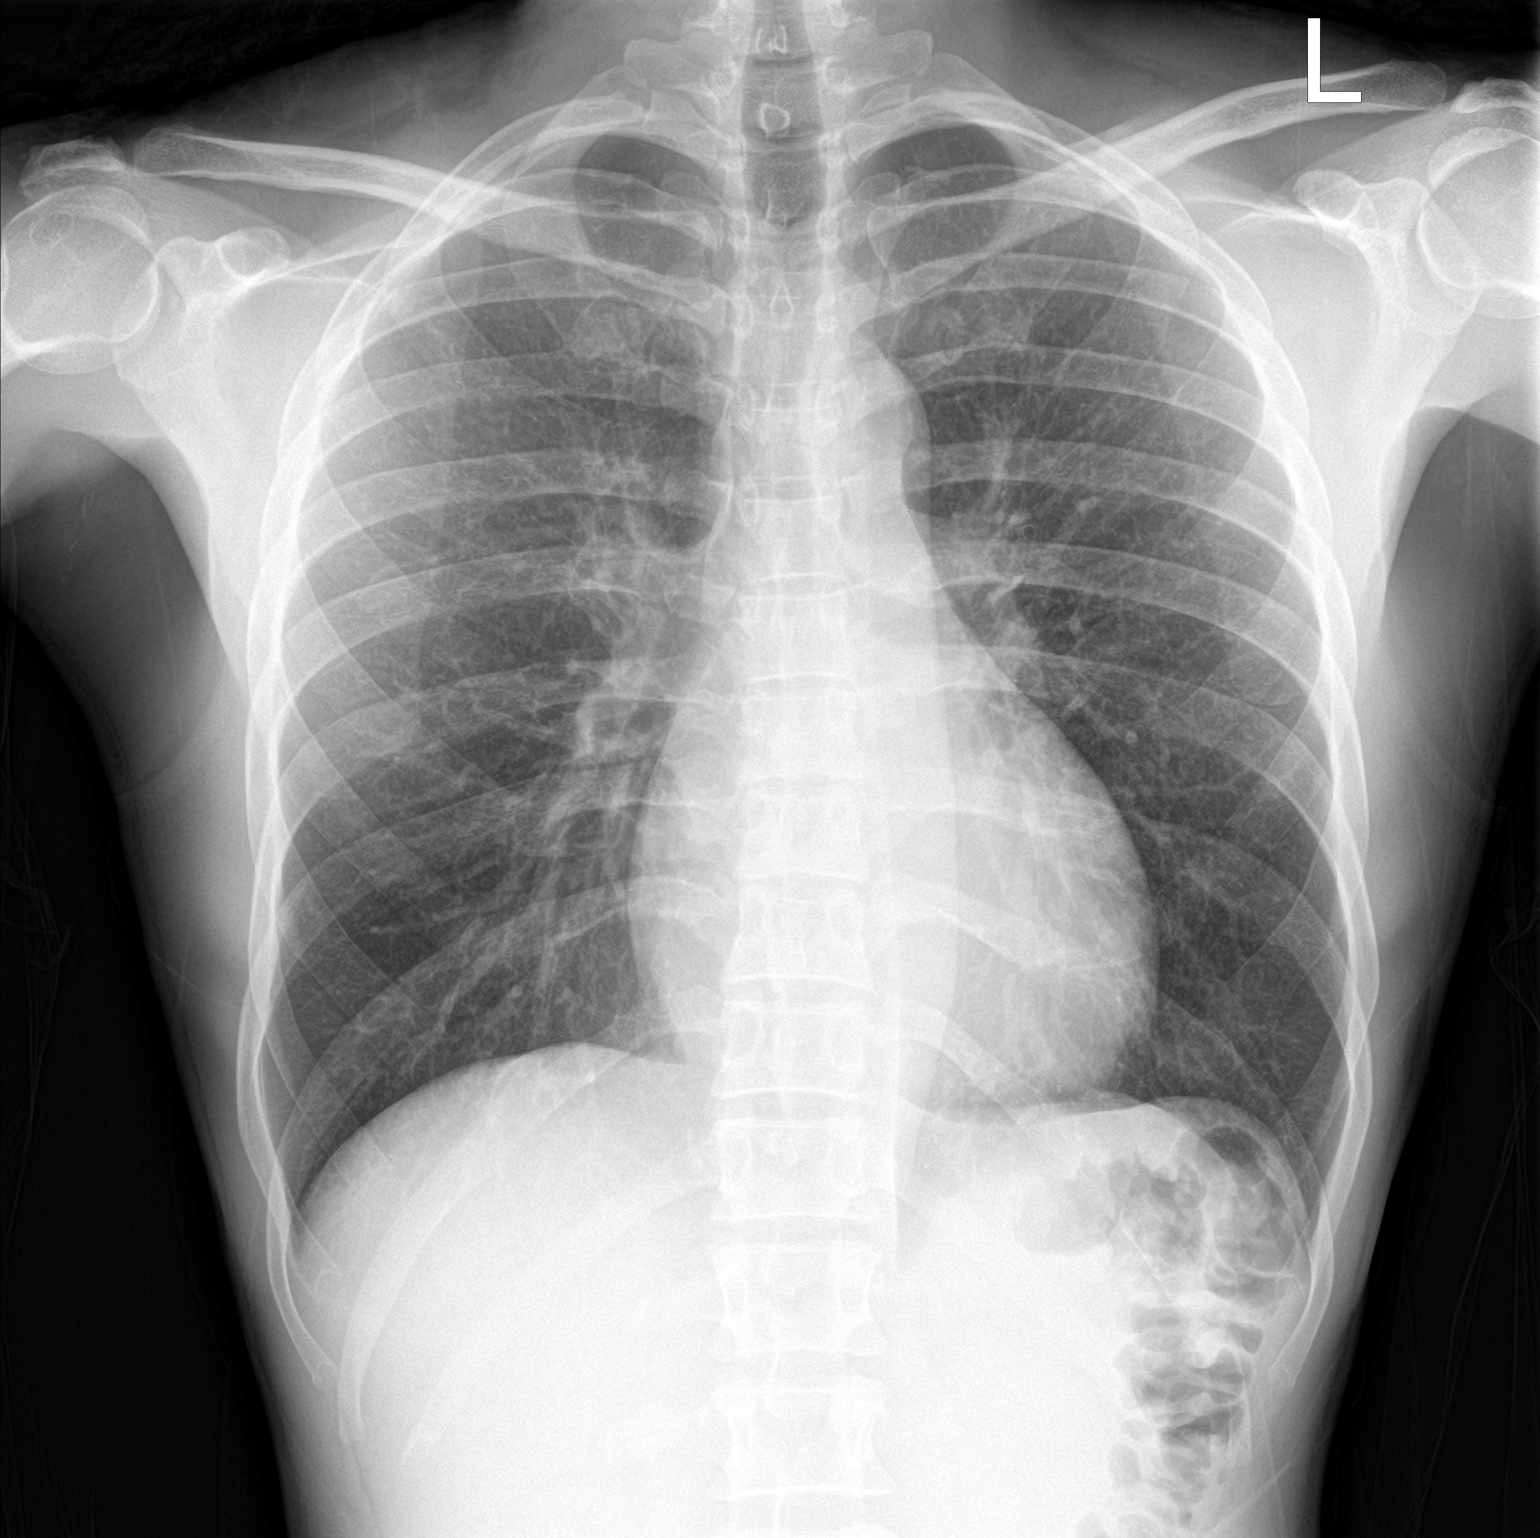

[chest lat]
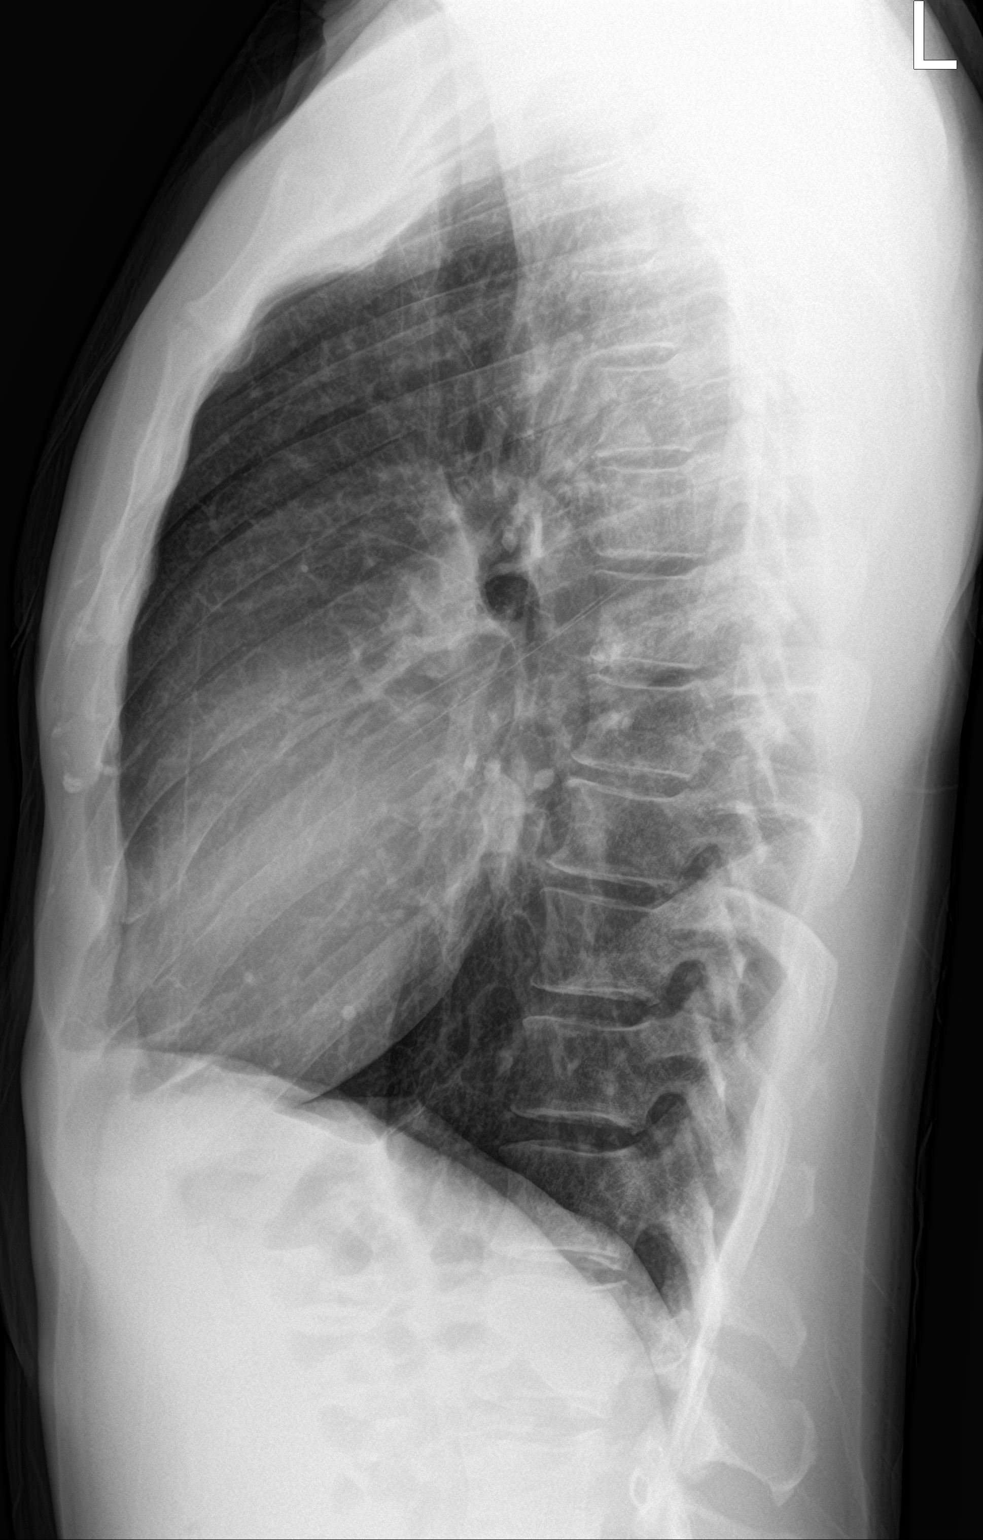

[2 of 2 positions shown; findings below may reference images not displayed]

FINDINGS: Heart size and mediastinal contours are within normal limits. Lungs
are clear. Lung volumes are normal. No pleural effusion or
pneumothorax is seen. Osseous structures about the chest are
unremarkable.
IMPRESSION: No active cardiopulmonary disease.  No evidence of pneumonia.
